# Patient Record
Sex: Female | Born: 1935 | Race: White | Hispanic: No
Health system: Southern US, Community
[De-identification: ages and names within clinical notes are randomized; demographics above are authoritative.]

---

## 2004-04-03 ENCOUNTER — Other Ambulatory Visit: Payer: Self-pay

## 2004-04-04 ENCOUNTER — Other Ambulatory Visit: Payer: Self-pay

## 2004-08-23 ENCOUNTER — Inpatient Hospital Stay: Payer: Self-pay | Admitting: Internal Medicine

## 2004-08-27 ENCOUNTER — Other Ambulatory Visit: Payer: Self-pay

## 2005-04-24 ENCOUNTER — Other Ambulatory Visit: Payer: Self-pay

## 2005-04-24 ENCOUNTER — Emergency Department: Payer: Self-pay | Admitting: Emergency Medicine

## 2005-04-29 ENCOUNTER — Ambulatory Visit: Payer: Self-pay | Admitting: Family Medicine

## 2005-11-05 ENCOUNTER — Encounter: Payer: Self-pay | Admitting: Family Medicine

## 2005-11-20 ENCOUNTER — Encounter: Payer: Self-pay | Admitting: Family Medicine

## 2005-12-20 ENCOUNTER — Ambulatory Visit: Payer: Self-pay | Admitting: Ophthalmology

## 2005-12-21 ENCOUNTER — Encounter: Payer: Self-pay | Admitting: Family Medicine

## 2006-01-08 ENCOUNTER — Ambulatory Visit: Payer: Self-pay | Admitting: Family Medicine

## 2006-01-20 ENCOUNTER — Encounter: Payer: Self-pay | Admitting: Family Medicine

## 2006-10-26 ENCOUNTER — Ambulatory Visit: Payer: Self-pay | Admitting: Family Medicine

## 2007-05-12 ENCOUNTER — Ambulatory Visit: Payer: Self-pay | Admitting: Family Medicine

## 2007-06-30 ENCOUNTER — Ambulatory Visit: Payer: Self-pay | Admitting: Family Medicine

## 2007-08-04 ENCOUNTER — Ambulatory Visit: Payer: Self-pay | Admitting: Family Medicine

## 2007-12-09 ENCOUNTER — Ambulatory Visit: Payer: Self-pay | Admitting: Family Medicine

## 2008-01-18 ENCOUNTER — Ambulatory Visit: Payer: Self-pay | Admitting: Family Medicine

## 2008-01-31 ENCOUNTER — Ambulatory Visit: Payer: Self-pay | Admitting: Family Medicine

## 2008-05-11 ENCOUNTER — Ambulatory Visit: Payer: Self-pay | Admitting: Family Medicine

## 2008-11-17 ENCOUNTER — Inpatient Hospital Stay: Payer: Self-pay | Admitting: General Practice

## 2008-11-24 ENCOUNTER — Ambulatory Visit: Payer: Self-pay | Admitting: Internal Medicine

## 2009-04-02 ENCOUNTER — Encounter: Payer: Self-pay | Admitting: Internal Medicine

## 2009-04-22 ENCOUNTER — Encounter: Payer: Self-pay | Admitting: Internal Medicine

## 2010-04-20 ENCOUNTER — Emergency Department: Payer: Self-pay | Admitting: Emergency Medicine

## 2010-04-29 ENCOUNTER — Ambulatory Visit: Payer: Self-pay | Admitting: Internal Medicine

## 2010-05-06 ENCOUNTER — Ambulatory Visit: Payer: Self-pay | Admitting: Internal Medicine

## 2010-05-13 ENCOUNTER — Ambulatory Visit: Payer: Self-pay | Admitting: Specialist

## 2010-05-20 ENCOUNTER — Ambulatory Visit: Payer: Self-pay | Admitting: Family Medicine

## 2010-05-21 ENCOUNTER — Ambulatory Visit: Payer: Self-pay | Admitting: Internal Medicine

## 2010-05-28 ENCOUNTER — Ambulatory Visit: Payer: Self-pay | Admitting: Internal Medicine

## 2010-06-03 ENCOUNTER — Ambulatory Visit: Payer: Self-pay | Admitting: Internal Medicine

## 2010-06-10 ENCOUNTER — Ambulatory Visit: Payer: Self-pay | Admitting: Internal Medicine

## 2010-07-22 ENCOUNTER — Ambulatory Visit: Payer: Self-pay

## 2010-07-25 ENCOUNTER — Inpatient Hospital Stay: Payer: Self-pay | Admitting: Internal Medicine

## 2010-07-25 ENCOUNTER — Ambulatory Visit: Payer: Self-pay

## 2010-07-31 ENCOUNTER — Ambulatory Visit: Payer: Self-pay | Admitting: Internal Medicine

## 2010-08-05 ENCOUNTER — Ambulatory Visit: Payer: Self-pay | Admitting: Internal Medicine

## 2010-08-08 ENCOUNTER — Ambulatory Visit: Payer: Self-pay | Admitting: Internal Medicine

## 2010-08-14 ENCOUNTER — Ambulatory Visit: Payer: Self-pay | Admitting: Internal Medicine

## 2010-08-20 ENCOUNTER — Ambulatory Visit: Payer: Self-pay | Admitting: Internal Medicine

## 2010-08-26 ENCOUNTER — Ambulatory Visit: Payer: Self-pay | Admitting: Internal Medicine

## 2010-09-02 ENCOUNTER — Ambulatory Visit: Payer: Self-pay | Admitting: Internal Medicine

## 2010-09-09 ENCOUNTER — Ambulatory Visit: Payer: Self-pay | Admitting: Internal Medicine

## 2010-09-18 ENCOUNTER — Ambulatory Visit: Payer: Self-pay

## 2011-08-02 ENCOUNTER — Emergency Department: Payer: Self-pay | Admitting: Emergency Medicine

## 2011-11-03 ENCOUNTER — Ambulatory Visit: Payer: Self-pay | Admitting: Urology

## 2011-11-28 ENCOUNTER — Emergency Department: Payer: Self-pay | Admitting: Emergency Medicine

## 2012-01-02 ENCOUNTER — Inpatient Hospital Stay: Payer: Self-pay | Admitting: Specialist

## 2012-01-02 LAB — CBC
MCH: 29.5 pg (ref 26.0–34.0)
MCHC: 31.9 g/dL — ABNORMAL LOW (ref 32.0–36.0)
MCV: 93 fL (ref 80–100)
Platelet: 217 10*3/uL (ref 150–440)
WBC: 12.6 10*3/uL — ABNORMAL HIGH (ref 3.6–11.0)

## 2012-01-02 LAB — URINALYSIS, COMPLETE
Bacteria: NONE SEEN
Bilirubin,UR: NEGATIVE
Nitrite: NEGATIVE
Ph: 6 (ref 4.5–8.0)
RBC,UR: 2 /HPF (ref 0–5)
Specific Gravity: 1.004 (ref 1.003–1.030)

## 2012-01-02 LAB — COMPREHENSIVE METABOLIC PANEL
Albumin: 3.7 g/dL (ref 3.4–5.0)
Alkaline Phosphatase: 206 U/L — ABNORMAL HIGH (ref 50–136)
BUN: 37 mg/dL — ABNORMAL HIGH (ref 7–18)
Bilirubin,Total: 1 mg/dL (ref 0.2–1.0)
Chloride: 104 mmol/L (ref 98–107)
Creatinine: 1.22 mg/dL (ref 0.60–1.30)
EGFR (African American): 44 — ABNORMAL LOW
Osmolality: 284 (ref 275–301)
Potassium: 4.6 mmol/L (ref 3.5–5.1)
SGOT(AST): 63 U/L — ABNORMAL HIGH (ref 15–37)
SGPT (ALT): 30 U/L
Sodium: 138 mmol/L (ref 136–145)

## 2012-01-02 LAB — TROPONIN I: Troponin-I: <0.02 ng/mL

## 2012-01-02 LAB — PROTIME-INR: Prothrombin Time: 24.2 secs — ABNORMAL HIGH (ref 11.5–14.7)

## 2012-01-02 LAB — CK TOTAL AND CKMB (NOT AT ARMC): CK-MB: 19.2 ng/mL — ABNORMAL HIGH (ref 0.5–3.6)

## 2012-01-03 LAB — CBC WITH DIFFERENTIAL/PLATELET
Basophil #: 0 10*3/uL (ref 0.0–0.1)
Basophil %: 0.3 %
Eosinophil %: 2 %
HCT: 35.8 % (ref 35.0–47.0)
Lymphocyte #: 3 10*3/uL (ref 1.0–3.6)
Lymphocyte %: 26.9 %
MCH: 29 pg (ref 26.0–34.0)
MCHC: 31.7 g/dL — ABNORMAL LOW (ref 32.0–36.0)
MCV: 92 fL (ref 80–100)
Monocyte #: 1.6 x10 3/mm — ABNORMAL HIGH (ref 0.2–0.9)
Neutrophil #: 6.4 10*3/uL (ref 1.4–6.5)
Neutrophil %: 56.6 %
Platelet: 188 10*3/uL (ref 150–440)
RBC: 3.91 10*6/uL (ref 3.80–5.20)
RDW: 16 % — ABNORMAL HIGH (ref 11.5–14.5)
WBC: 11.3 10*3/uL — ABNORMAL HIGH (ref 3.6–11.0)

## 2012-01-03 LAB — BASIC METABOLIC PANEL
Anion Gap: 12 (ref 7–16)
Chloride: 109 mmol/L — ABNORMAL HIGH (ref 98–107)
Co2: 22 mmol/L (ref 21–32)
Creatinine: 1.09 mg/dL (ref 0.60–1.30)
EGFR (Non-African Amer.): 43 — ABNORMAL LOW
Osmolality: 291 (ref 275–301)
Potassium: 4.5 mmol/L (ref 3.5–5.1)

## 2012-01-03 LAB — CK TOTAL AND CKMB (NOT AT ARMC)
CK, Total: 71 U/L (ref 21–215)
CK, Total: 61 U/L (ref 21–215)
CK-MB: 17.4 ng/mL — ABNORMAL HIGH (ref 0.5–3.6)

## 2012-01-03 LAB — PROTIME-INR: Prothrombin Time: 23.6 secs — ABNORMAL HIGH (ref 11.5–14.7)

## 2012-01-13 ENCOUNTER — Ambulatory Visit: Payer: Self-pay | Admitting: Family Medicine

## 2012-01-13 LAB — CREATININE, SERUM: Creatinine: 1.63 mg/dL — ABNORMAL HIGH (ref 0.60–1.30)

## 2012-02-21 DEATH — deceased

## 2013-07-20 IMAGING — CT CT ABD-PELV W/ CM
1 of 3 series · 12 of 32 positions shown, 18 images · non-contrast
Comparison: none

REASON FOR EXAM: Epigastric Pain RUQ Pain Hx Bladder Breast CA
COMMENTS:

PROCEDURE:     ES - ES ABDOMEN / PELVIS W  - November 03, 2011 [DATE]
RESULT:
TECHNIQUE: CT of the abdomen and pelvis is performed with 85 ml of
Lsovue-SOO iodinated intravenous contrast and oral contrast with images
reconstructed at 5.0 mm slice thickness in the axial plane.
There is no previous examination for comparison.

[Series 2: soft tissue · axial · 0.76mm/px · z∈[-404,-29]mm · 12 of 89 slices shown, 18 images]
[im 7/89  soft-tissue]
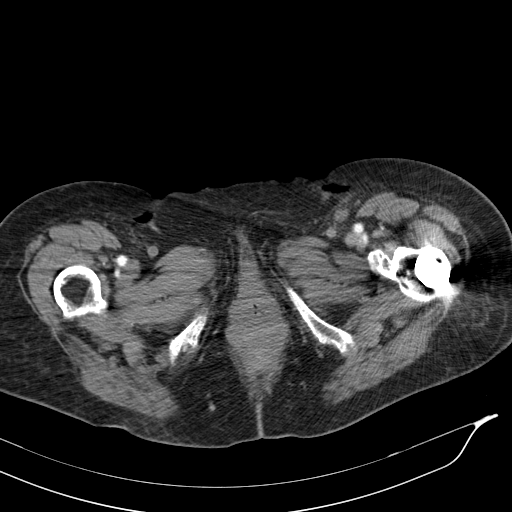
[im 7/89  bone]
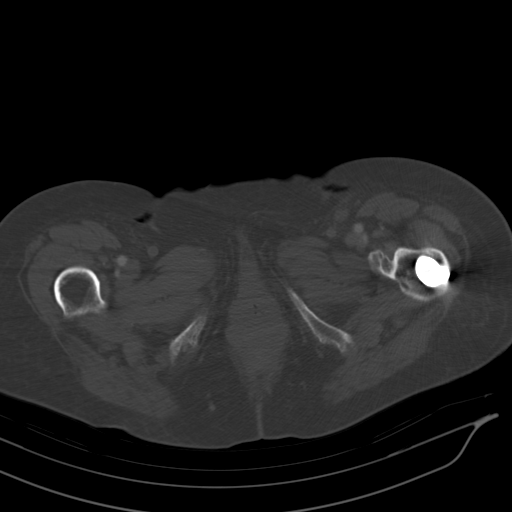
[im 14/89  soft-tissue]
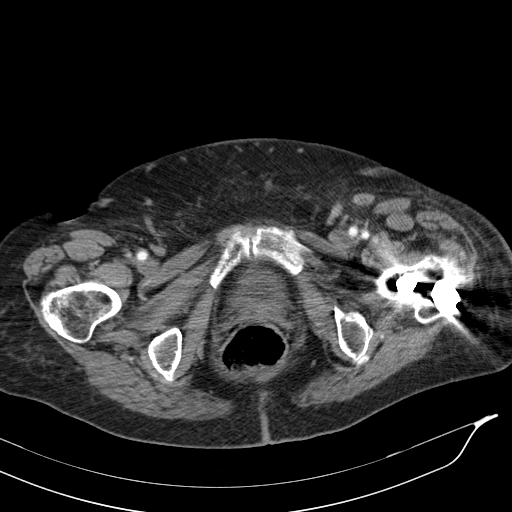
[im 21/89  soft-tissue]
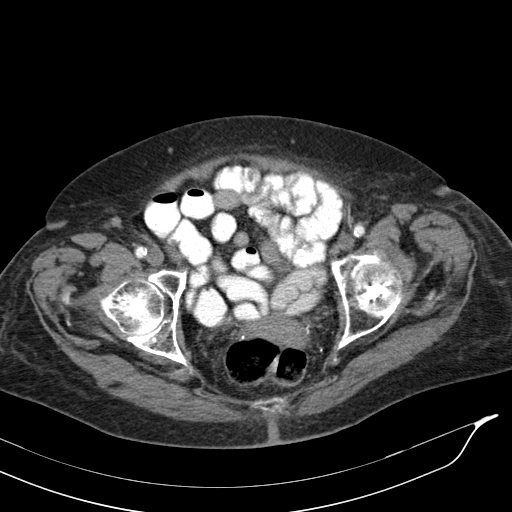
[im 28/89  soft-tissue]
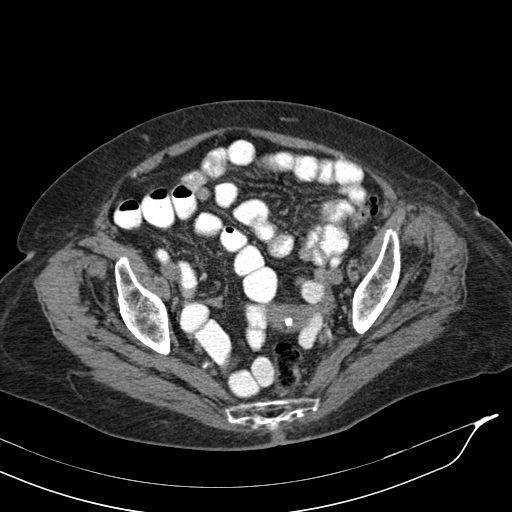
[im 34/89  soft-tissue]
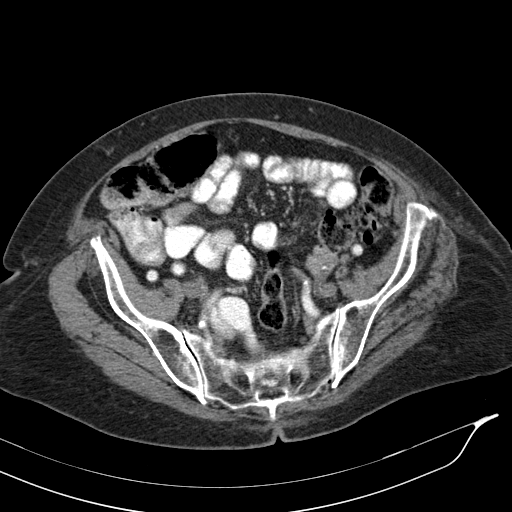
[im 41/89  soft-tissue]
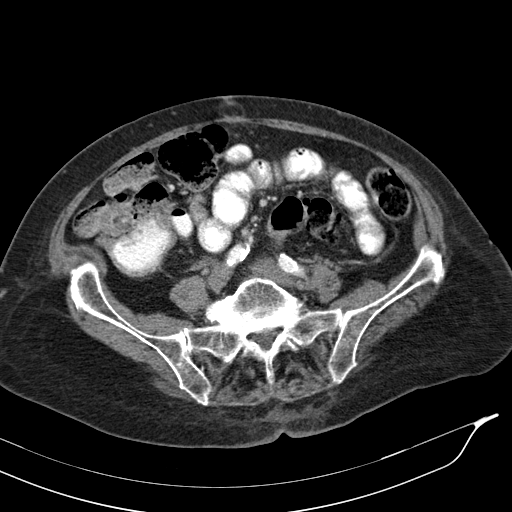
[im 48/89  soft-tissue]
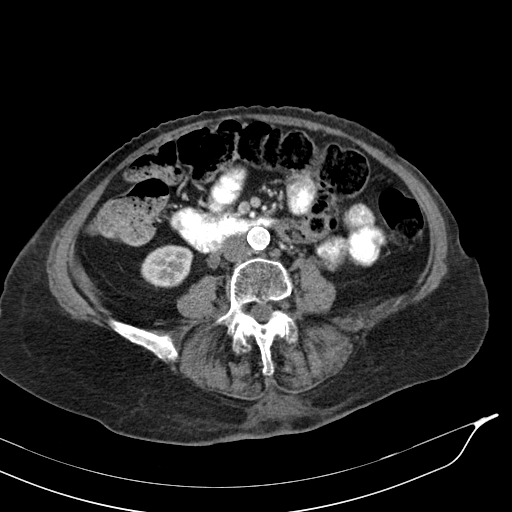
[im 55/89  soft-tissue]
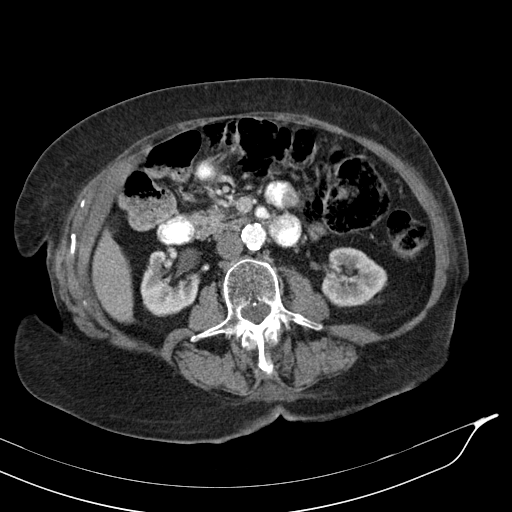
[im 61/89  soft-tissue]
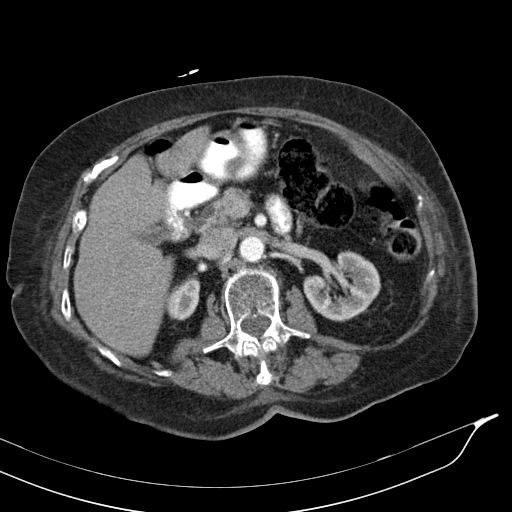
[im 61/89  lung]
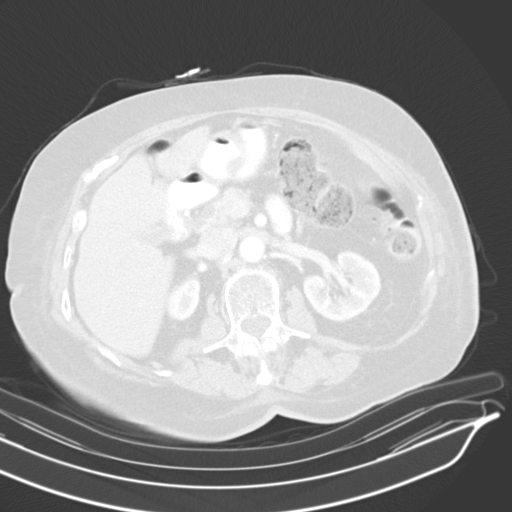
[im 61/89  bone]
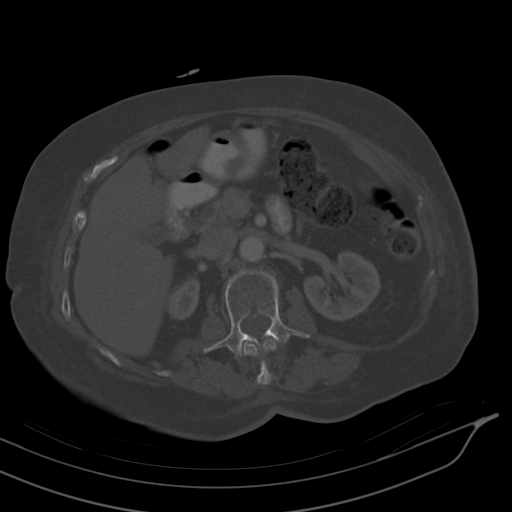
[im 68/89  soft-tissue]
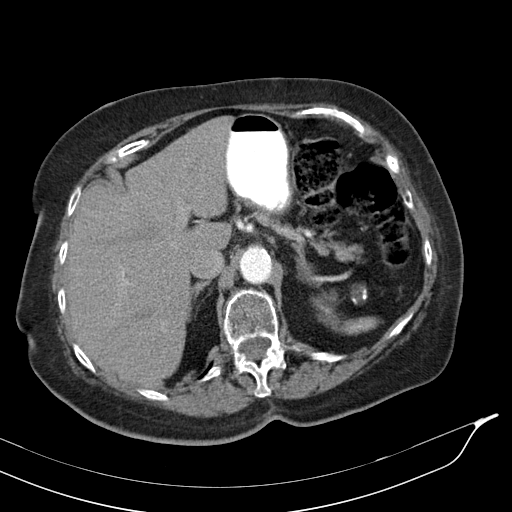
[im 68/89  lung]
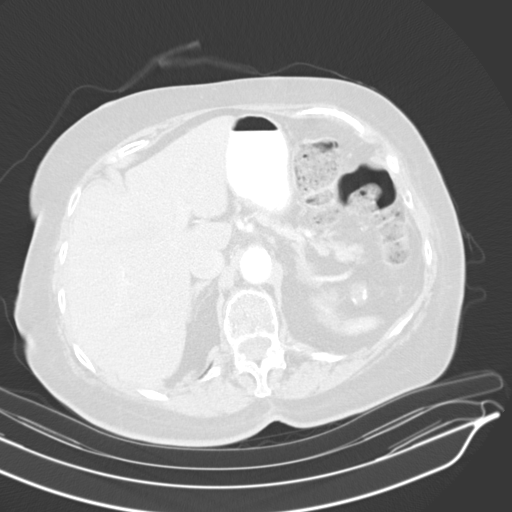
[im 75/89  soft-tissue]
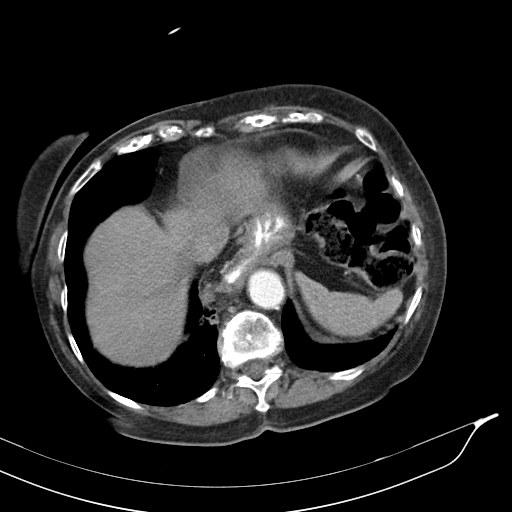
[im 75/89  lung]
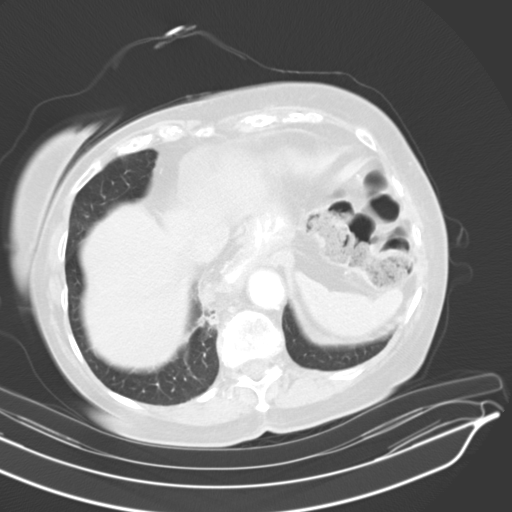
[im 82/89  soft-tissue]
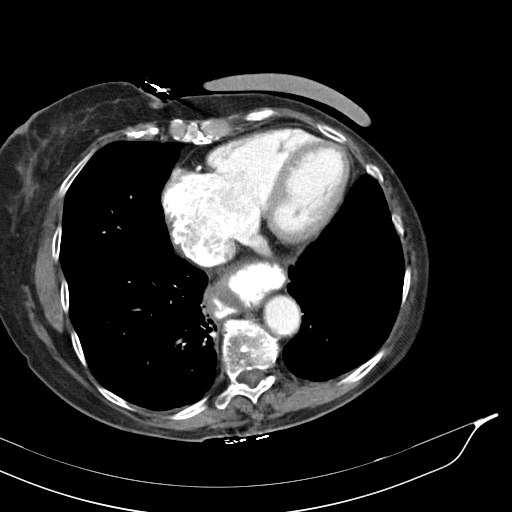
[im 82/89  lung]
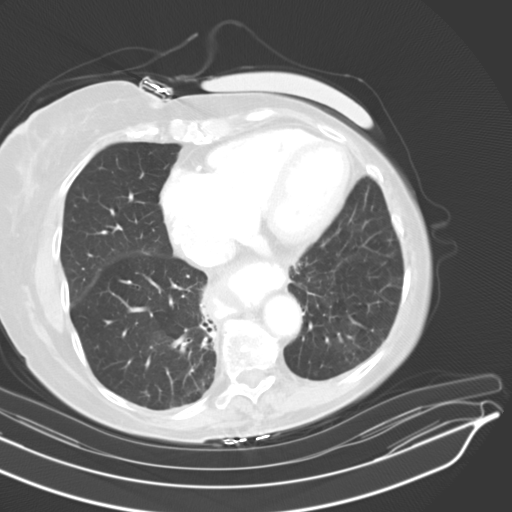

[12 of 32 positions shown; findings below may reference images not displayed]

FINDINGS: Changes of previous left mastectomy are present. Lung window
images demonstrate some minimal presumed fibrosis or atelectasis at the lung
bases. There is a small, sliding type, hiatal hernia present. The
gallbladder appears nondistended. No intrahepatic biliary ductal dilation is
present. No discrete hepatic mass is appreciated. The kidneys show no
obstruction or solid or cystic mass. The pancreas shows evidence of atrophy.
The spleen is normal in size. There is a circumferentially calcified area
within the splenic hilum which may represent a thrombosed calcified aneurysm
measuring up to approximately 2.2 cm. There is a small, fat filled umbilical
hernia present. Atherosclerotic calcification is noted within the abdominal
aorta without evidence of aneurysmal dilation. The urinary bladder appears
to be nondistended. No focal wall thickening is present. An atrophic uterus
containing some areas of calcification suggests leiomyomatous calcified
lesions. No adenopathy is seen within the mesentery or retroperitoneum. No
porta hepatis adenopathy is evident. There is a moderate amount of fecal
material scattered through the colon without evidence of obstruction or
perforation. No abscess or acute inflammation is evident. The adrenal glands
appear unremarkable. There is artifact from the patient's previous left hip
ORIF with an intramedullary rod and compression screw.
IMPRESSION: 1.  Sliding type, hiatal hernia.
2.  Presumed thrombosed splenic artery aneurysm in the splenic hilum.
3.  Atherosclerotic disease otherwise.
4.  No focal bladder abnormality evident. No evidence of pelvic or
retroperitoneal or mesenteric adenopathy.
5.  Not mentioned above, are diffuse degenerative changes in the lumbar
spine without compression fracture. Multilevel degenerative disc space
narrowing and hypertrophic spurring is present.

## 2015-01-14 NOTE — Discharge Summary (Signed)
PATIENT NAME:  Chelsea BossierVANS, Chelsea Doyle MR#:  [id number] DATE OF BIRTH:  1915-05-11  DATE OF ADMISSION:  01/02/2012 DATE OF DISCHARGE:  01/04/2012  For a detailed note, please see the History and Physical done by Dr. Aram BeechamJeffrey Sparks on admission.   DIAGNOSES AT DISCHARGE:  1. Suspected transient ischemic attack, now ruled out. 2. Chronic atrial fibrillation.  3. Hypertension.  4. Hyperlipidemia.  5. History of breast cancer.   DIET: The patient is being discharged on a low-sodium, low-fat diet.   ACTIVITY: As tolerated.   FOLLOWUP: Follow up with Dr. Elizabeth Sauereanna Jones in the next 1 to 2 weeks.   DISCHARGE MEDICATIONS:   1. Maalox as needed.  2. Dulcolax suppository as needed.  3. Coumadin 5 mg daily.  4. Senokot 1 tab b.i.d.  5. Milk of Magnesia  30 mL  b.i.d. as needed.  6. Tylenol 500 mg 2 tabs q. 4 hours as needed.  7. Tylenol with oxycodone 1 to 2 tabs q. 6 h. as needed.  8. Imdur 60 mg daily.   9. Metoprolol tartrate 12.5 mg daily.   PERTINENT STUDIES DURING HOSPITAL COURSE:  CT scan of the head done without contrast showing chronic ischemic change with no acute abnormality.  Ultrasound of carotids showing mild bilateral carotid plaque but no hemodynamically significant carotid stenosis. Chest x-ray done on admission showed chronic obstructive pulmonary disease with left mastectomy with no acute abnormality.   BRIEF HOSPITAL COURSE: This is a [age] year old female who presented to the hospital on 01/02/2012 due to slurred speech and slight facial droop, suspicious for a transient ischemic attack.  1. Slurred speech and facial droop: The patient was admitted for diagnosis of suspected transient ischemic attack. She underwent an extensive work-up including a CT head and carotid duplex, which were essentially negative. I did not do an echocardiogram as her INR was therapeutic and she was unlikely to have a thrombus even though she has chronic atrial fibrillation. Her symptoms were pretty much back down  to baseline shortly after admission, and she has had no further evidence of weakness or any neurological symptoms during the hospital course. She was evaluated by physical therapy and it was thought she would benefit from short-term rehab, although the patient adamantly refused and her family did not want her going to short-term rehab either, so she is being discharged home with Home Health PT and nursing care to be resumed.  2. Chronic atrial fibrillation: As mentioned, the patient is rate controlled although she was a bit bradycardic when she came in. Her metoprolol dose was held. Her bradycardia since then has resolved. I cut her dose of metoprolol from 25 b.i.d. to just 12.5 daily. She will resume her warfarin and INR was therapeutic upon discharge at 2.1.  3. Hypertension: The patient remained hemodynamically stable on her Imdur which she will continue along with low-dose metoprolol.  4. Hyperlipidemia: The patient currently is not taking anything for hyperlipidemia.  5. Chronic pain: The patient is on Norco as needed, which she will resume along with stool softeners to prevent constipation.   CODE STATUS: The patient is a DO NOT INTUBATE, DO NOT RESUSCITATE.   DISPOSITION: She is being discharged home with Home Health physical therapy and nursing services as she refused to go to short-term rehab.   TIME SPENT:  40 minutes.  ____________________________ Rolly PancakeVivek J. Cherlynn KaiserSainani, MD [id number] D: 01/04/2012 15:49:28 ET T: 01/06/2012 10:21:03 ET JOB#: [id number]  cc: Duanne Limerickeanna C. Jones, MD Houston SirenVIVEK J Jymir Dunaj MD ELECTRONICALLY SIGNED 01/07/2012  16:17 

## 2015-01-14 NOTE — H&P (Signed)
PATIENT NAME:  Chelsea BossierVANS, Chelsea Doyle MR#:  [id number] DATE OF BIRTH:  1915-03-07  DATE OF ADMISSION:  01/02/2012  REFERRING PHYSICIAN: Pamala Duffelobb Wiegand, MD     FAMILY PHYSICIAN: Elizabeth Sauereanna Jones, MD    REASON FOR ADMISSION: Stuttering, transient ischemic attacks with bradycardia.   HISTORY OF PRESENT ILLNESS: The patient is a 81 year old female who lives by herself with a history of coronary artery disease, status post myocardial infarction, chronic atrial fibrillation, and medical noncompliance. She presents to the Emergency Room with three separate transient ischemic attacks this week. She was in the office of her doctor this afternoon where she was noted to have a right facial droop and expressive aphasia. She was noted to be also bradycardia. She was sent to the Emergency Room where her symptoms improved. She remained bradycardic. She is now admitted for further evaluation. She does live by herself and is unable to care for herself. She is not taking her medications as prescribed.   PAST MEDICAL/SURGICAL HISTORY:  1. Atherosclerotic cardiovascular disease, status post myocardial infarction.  2. Chronic angina.  3. Previous stroke.  4. History of pneumonia.  5. Chronic atrial fibrillation.  6. Benign hypertension.  7. Hyperlipidemia.  8. History of bladder cancer.  9. History of breast cancer, status post left mastectomy.  10. Status post left hip surgery.   MEDICATIONS:  1. Lopressor 25 mg p.o. b.i.d.  2. Imdur 60 mg p.o. daily.  3. Coumadin 5 mg p.o. p.Doyle.  4. Percocet p.r.n. pain.   ALLERGIES: Codeine.   SOCIAL HISTORY: The patient is widowed and lives alone. No history of alcohol or tobacco abuse.   FAMILY HISTORY: Unable to obtain from patient.   REVIEW OF SYSTEMS: Unable to obtain from patient.   PHYSICAL EXAMINATION:  GENERAL: The patient is elderly, confused, but in no acute distress.   VITAL SIGNS: Vital signs are currently remarkable for a blood pressure of 116/61, with a heart  rate of 49, respiratory rate of 20. She is afebrile.   HEENT: Normocephalic, atraumatic. Pupils are equally round and reactive to light and accommodation. Extraocular movements are intact. Sclerae are anicteric. Conjunctivae are clear. Oropharynx is clear.   NECK: Supple without jugular venous distention. No adenopathy or thyromegaly is noted.   LUNGS: Clear to auscultation and percussion without wheezes, rales, or rhonchi. No dullness.   CARDIAC: Irregularly irregular rhythm with a slow rate. No significant rubs or gallops.   ABDOMEN: Soft, nontender, with normoactive bowel sounds. No organomegaly, masses were appreciated. No hernias or bruits were noted.   EXTREMITIES: No clubbing, cyanosis, or edema. Pulses are 1+ bilaterally.   SKIN: Warm and dry without rash or lesions.   NEUROLOGIC: Cranial nerves II through XII are grossly intact. Deep tendon reflexes were symmetric. Motor and sensory exam is nonfocal.   PSYCHIATRIC: Exam revealed a patient who is  alert but disoriented to person, place, and time.     LABORATORY, DIAGNOSTIC AND RADIOLOGICAL DATA:  EKG reveals atrial fibrillation with a rate of 49. No acute ischemic changes were noted. Right bundle branch block was present.  Head CT revealed chronic ischemic changes with no acute abnormalities.  Glucose was 97, with a BUN of 37, creatinine 1.22, sodium 138, and a potassium of 4.6, with a GFR of 38.  CBC revealed a white count of 12.6, with a hemoglobin of 12.1.   ASSESSMENT:  1. Recurrent transient ischemic attacks.  2. Bradycardia.  3. Chronic atrial fibrillation.  4. Dehydration.  5. Stage III kidney disease.  6. Atherosclerotic cardiovascular disease.  7. Senile dementia.   PLAN: The patient will be admitted to telemetry as a NO CODE BLUE, DO NOT RESUSCITATE. She will be maintained on Lovenox. We will resume her Coumadin. We will follow her pro times closely. Hold her Lopressor at this time because of her bradycardia. We  will check an echocardiogram and carotid Dopplers because of her recurrent transient ischemic attacks. We will consult Speech Therapy, Physical Therapy, and the social worker for placement. Begin IV fluids for hydration. Follow up routine labs in the morning. Soft diet for now. Further treatment and evaluation will depend upon the patient's progress.   TOTAL TIME SPENT:  50 minutes.    ____________________________ Duane Lope Judithann Sheen, MD [id number] D: 01/02/2012 23:26:52 ET T: 01/03/2012 08:23:52 ET JOB#: [id number]  cc: Duane Lope. Judithann Sheen, MD, <Dictator> Duanne Limerick, MD Davine Coba Rodena Medin MD ELECTRONICALLY SIGNED 01/03/2012 22:19
